# Patient Record
Sex: Male | Born: 1990 | Race: White | Hispanic: No | Marital: Married | Smoking: Never smoker
Health system: Southern US, Community
[De-identification: ages and names within clinical notes are randomized; demographics above are authoritative.]

---
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
## 2016-08-28 ENCOUNTER — Other Ambulatory Visit: Admit: 2016-08-28 | Admitting: Family Medicine

## 2016-09-04 ENCOUNTER — Other Ambulatory Visit
Admission: EM | Admit: 2016-09-04 | Discharge: 2016-09-04 | Disposition: A | Discharge status: Home / Self Care | Attending: Family Medicine | Admitting: Family Medicine

## 2016-09-04 NOTE — ED Notes (Signed)
Completed UDS and delivered to lab for courier p/u.

## 2016-09-05 ENCOUNTER — Emergency Department: Admission: EM | Admit: 2016-09-05 | Discharge: 2016-09-05 | Discharge status: Absent without leave

## 2019-04-11 DIAGNOSIS — H5213 Myopia, bilateral: Secondary | ICD-10-CM | POA: Diagnosis not present

## 2019-09-10 DIAGNOSIS — S91332A Puncture wound without foreign body, left foot, initial encounter: Secondary | ICD-10-CM | POA: Diagnosis not present

## 2019-11-13 DIAGNOSIS — M545 Low back pain: Secondary | ICD-10-CM | POA: Diagnosis not present

## 2019-11-13 DIAGNOSIS — R112 Nausea with vomiting, unspecified: Secondary | ICD-10-CM | POA: Diagnosis not present

## 2019-11-13 DIAGNOSIS — R300 Dysuria: Secondary | ICD-10-CM | POA: Diagnosis not present

## 2019-11-13 DIAGNOSIS — R109 Unspecified abdominal pain: Secondary | ICD-10-CM | POA: Diagnosis not present

## 2019-11-27 NOTE — Progress Notes (Signed)
Scheduled to complete physical 12/05/19 with Dr. Williams.  AMD 

## 2019-11-28 ENCOUNTER — Ambulatory Visit: Payer: Self-pay

## 2019-11-28 ENCOUNTER — Other Ambulatory Visit: Payer: Self-pay

## 2019-11-28 DIAGNOSIS — Z0000 Encounter for general adult medical examination without abnormal findings: Secondary | ICD-10-CM

## 2019-11-28 LAB — POCT URINALYSIS DIPSTICK
Bilirubin, UA: NEGATIVE
Blood, UA: NEGATIVE
Glucose, UA: NEGATIVE
Ketones, UA: NEGATIVE
Leukocytes, UA: NEGATIVE
Nitrite, UA: NEGATIVE
Protein, UA: NEGATIVE
Spec Grav, UA: <=1.005 — AB (ref 1.010–1.025)
Urobilinogen, UA: 0.2 E.U./dL
pH, UA: 6.5 (ref 5.0–8.0)

## 2019-11-29 LAB — CMP12+LP+TP+TSH+6AC+CBC/D/PLT
ALT: 36 IU/L (ref 0–44)
AST: 42 IU/L — ABNORMAL HIGH (ref 0–40)
Albumin/Globulin Ratio: 1.8 (ref 1.2–2.2)
Albumin: 4.4 g/dL (ref 4.1–5.2)
Alkaline Phosphatase: 45 IU/L — ABNORMAL LOW (ref 48–121)
BUN/Creatinine Ratio: 9 (ref 9–20)
BUN: 14 mg/dL (ref 6–20)
Basophils Absolute: 0 10*3/uL (ref 0.0–0.2)
Basos: 1 %
Bilirubin Total: 0.5 mg/dL (ref 0.0–1.2)
Calcium: 9.4 mg/dL (ref 8.7–10.2)
Chloride: 101 mmol/L (ref 96–106)
Chol/HDL Ratio: 2.2 ratio (ref 0.0–5.0)
Cholesterol, Total: 142 mg/dL (ref 100–199)
Creatinine, Ser: 1.51 mg/dL — ABNORMAL HIGH (ref 0.76–1.27)
EOS (ABSOLUTE): 0.1 10*3/uL (ref 0.0–0.4)
Eos: 2 %
Estimated CHD Risk: <0.5 times avg. (ref 0.0–1.0)
Free Thyroxine Index: 2.3 (ref 1.2–4.9)
GFR calc Af Amer: 72 mL/min/{1.73_m2} (ref >59)
GFR calc non Af Amer: 62 mL/min/{1.73_m2} (ref >59)
GGT: 20 IU/L (ref 0–65)
Globulin, Total: 2.5 g/dL (ref 1.5–4.5)
Glucose: 109 mg/dL — ABNORMAL HIGH (ref 65–99)
HDL: 66 mg/dL (ref >39)
Hematocrit: 43.2 % (ref 37.5–51.0)
Hemoglobin: 15 g/dL (ref 13.0–17.7)
Immature Grans (Abs): 0 10*3/uL (ref 0.0–0.1)
Immature Granulocytes: 0 %
Iron: 133 ug/dL (ref 38–169)
LDH: 177 IU/L (ref 121–224)
LDL Chol Calc (NIH): 67 mg/dL (ref 0–99)
Lymphocytes Absolute: 1.7 10*3/uL (ref 0.7–3.1)
Lymphs: 34 %
MCH: 30.4 pg (ref 26.6–33.0)
MCHC: 34.7 g/dL (ref 31.5–35.7)
MCV: 87 fL (ref 79–97)
Monocytes Absolute: 0.5 10*3/uL (ref 0.1–0.9)
Monocytes: 11 %
Neutrophils Absolute: 2.7 10*3/uL (ref 1.4–7.0)
Neutrophils: 52 %
Phosphorus: 3.1 mg/dL (ref 2.8–4.1)
Platelets: 245 10*3/uL (ref 150–450)
Potassium: 4.2 mmol/L (ref 3.5–5.2)
RBC: 4.94 x10E6/uL (ref 4.14–5.80)
RDW: 12.5 % (ref 11.6–15.4)
Sodium: 139 mmol/L (ref 134–144)
T3 Uptake Ratio: 27 % (ref 24–39)
T4, Total: 8.4 ug/dL (ref 4.5–12.0)
TSH: 1.84 u[IU]/mL (ref 0.450–4.500)
Total Protein: 6.9 g/dL (ref 6.0–8.5)
Triglycerides: 34 mg/dL (ref 0–149)
Uric Acid: 5.4 mg/dL (ref 3.8–8.4)
VLDL Cholesterol Cal: 9 mg/dL (ref 5–40)
WBC: 5 10*3/uL (ref 3.4–10.8)

## 2019-12-05 ENCOUNTER — Other Ambulatory Visit: Payer: Self-pay

## 2019-12-05 ENCOUNTER — Ambulatory Visit: Payer: Self-pay | Admitting: Emergency Medicine

## 2019-12-05 ENCOUNTER — Encounter: Payer: Self-pay | Admitting: Emergency Medicine

## 2019-12-05 VITALS — BP 135/84 | HR 66 | Temp 97.7°F | Resp 14 | Ht 73.0 in | Wt 215.0 lb

## 2019-12-05 DIAGNOSIS — Z0000 Encounter for general adult medical examination without abnormal findings: Secondary | ICD-10-CM

## 2019-12-05 DIAGNOSIS — R7989 Other specified abnormal findings of blood chemistry: Secondary | ICD-10-CM

## 2019-12-05 NOTE — Progress Notes (Signed)
ER Provider Note       Time seen: 10:06 AM    I have reviewed the vital signs and the nursing notes.  HISTORY   Chief Complaint Employment Physical    HPI Ronnie Francis is a 29 y.o. male with no known past medical history who presents today for annual physical examination.  Patient denies any specific complaints.  No past medical history on file.  Allergies Patient has no known allergies.  Review of Systems Constitutional: Negative for fever. Cardiovascular: Negative for chest pain. Respiratory: Negative for shortness of breath. Gastrointestinal: Negative for abdominal pain, vomiting and diarrhea. Musculoskeletal: Negative for back pain. Skin: Negative for rash. Neurological: Negative for headaches, focal weakness or numbness.  All systems negative/normal/unremarkable except as stated in the HPI  ____________________________________________   PHYSICAL EXAM:  VITAL SIGNS: Vitals:   12/05/19 0951  BP: 135/84  Pulse: 66  Resp: 14  Temp: 97.7 F (36.5 C)  SpO2: 96%    Constitutional: Alert and oriented. Well appearing and in no distress. Eyes: Conjunctivae are normal. Normal extraocular movements. Cardiovascular: Normal rate, regular rhythm. No murmurs, rubs, or gallops. Respiratory: Normal respiratory effort without tachypnea nor retractions. Breath sounds are clear and equal bilaterally. No wheezes/rales/rhonchi. Gastrointestinal: Soft and nontender. Normal bowel sounds Musculoskeletal: Nontender with normal range of motion in extremities. No lower extremity tenderness nor edema. Neurologic:  Normal speech and language. No gross focal neurologic deficits are appreciated.  Skin:  Skin is warm, dry and intact. No rash noted. Psychiatric: Speech and behavior are normal.  ____________________________________________   LABS (pertinent positives/negatives)  Recent Results (from the past 2160 hour(s))  CMP12+LP+TP+TSH+6AC+CBC/D/Plt     Status: Abnormal    Collection Time: 11/28/19  9:56 AM  Result Value Ref Range   Glucose 109 (H) 65 - 99 mg/dL   Uric Acid 5.4 3.8 - 8.4 mg/dL    Comment:            Therapeutic target for gout patients: <6.0   BUN 14 6 - 20 mg/dL   Creatinine, Ser 1.51 (H) 0.76 - 1.27 mg/dL   GFR calc non Af Amer 62 >59 mL/min/1.73   GFR calc Af Amer 72 >59 mL/min/1.73    Comment: **[facility] currently reports eGFR in compliance with the current**   recommendations of the [employer]. [facility] will   update reporting as new guidelines are published from the NKF-ASN   Task force.    BUN/Creatinine Ratio 9 9 - 20   Sodium 139 134 - 144 mmol/L   Potassium 4.2 3.5 - 5.2 mmol/L   Chloride 101 96 - 106 mmol/L   Calcium 9.4 8.7 - 10.2 mg/dL   Phosphorus 3.1 2.8 - 4.1 mg/dL   Total Protein 6.9 6.0 - 8.5 g/dL   Albumin 4.4 4.1 - 5.2 g/dL   Globulin, Total 2.5 1.5 - 4.5 g/dL   Albumin/Globulin Ratio 1.8 1.2 - 2.2   Bilirubin Total 0.5 0.0 - 1.2 mg/dL   Alkaline Phosphatase 45 (L) 48 - 121 IU/L   LDH 177 121 - 224 IU/L   AST 42 (H) 0 - 40 IU/L   ALT 36 0 - 44 IU/L   GGT 20 0 - 65 IU/L   Iron 133 38 - 169 ug/dL   Cholesterol, Total 142 100 - 199 mg/dL   Triglycerides 34 0 - 149 mg/dL   HDL 66 >39 mg/dL   VLDL Cholesterol Cal 9 5 - 40 mg/dL   LDL Chol Calc (NIH) 67 0 -  99 mg/dL   Chol/HDL Ratio 2.2 0.0 - 5.0 ratio    Comment:                                   T. Chol/HDL Ratio                                             Men  Women                               1/2 Avg.Risk  3.4    3.3                                   Avg.Risk  5.0    4.4                                2X Avg.Risk  9.6    7.1                                3X Avg.Risk 23.4   11.0    Estimated CHD Risk  < 0.5 0.0 - 1.0 times avg.    Comment: The CHD Risk is based on the T. Chol/HDL ratio. Other factors affect CHD Risk such as hypertension, smoking, diabetes, severe obesity, and family history of premature CHD.    TSH 1.840 0.450 - 4.500  uIU/mL   T4, Total 8.4 4.5 - 12.0 ug/dL   T3 Uptake Ratio 27 24 - 39 %   Free Thyroxine Index 2.3 1.2 - 4.9   WBC 5.0 3.4 - 10.8 x10E3/uL   RBC 4.94 4.14 - 5.80 x10E6/uL   Hemoglobin 15.0 13.0 - 17.7 g/dL   Hematocrit 43.2 37.5 - 51.0 %   MCV 87 79 - 97 fL   MCH 30.4 26.6 - 33.0 pg   MCHC 34.7 31 - 35 g/dL   RDW 12.5 11.6 - 15.4 %   Platelets 245 150 - 450 x10E3/uL   Neutrophils 52 Not Estab. %   Lymphs 34 Not Estab. %   Monocytes 11 Not Estab. %   Eos 2 Not Estab. %   Basos 1 Not Estab. %   Neutrophils Absolute 2.7 1 - 7 x10E3/uL   Lymphocytes Absolute 1.7 0 - 3 x10E3/uL   Monocytes Absolute 0.5 0 - 0 x10E3/uL   EOS (ABSOLUTE) 0.1 0.0 - 0.4 x10E3/uL   Basophils Absolute 0.0 0 - 0 x10E3/uL   Immature Granulocytes 0 Not Estab. %   Immature Grans (Abs) 0.0 0.0 - 0.1 x10E3/uL  POCT urinalysis dipstick     Status: Abnormal   Collection Time: 11/28/19  9:57 AM  Result Value Ref Range   Color, UA Light Yellow    Clarity, UA Clear    Glucose, UA Negative Negative   Bilirubin, UA Negative    Ketones, UA Negative    Spec Grav, UA <=1.005 (A) 1.010 - 1.025   Blood, UA Negative    pH, UA 6.5 5.0 - 8.0   Protein, UA Negative Negative   Urobilinogen, UA 0.2 0.2 or 1.0  E.U./dL   Nitrite, UA Negative    Leukocytes, UA Negative Negative   Appearance     Odor      DIFFERENTIAL DIAGNOSIS  Annual physical examination, chronically elevated creatinine  ASSESSMENT AND PLAN  Annual physical examination, elevated creatinine   Plan: The patient had presented for annual physical examination. Patient's labs did reveal a creatinine of 1.5 of uncertain etiology.  This appears to have been the case for the past 5 years, and it is slightly higher on recent lab work.  He will be referred to nephrology for outpatient follow-up.  Lenise Arena MD    Note: This note was generated in part or whole with voice recognition software. Voice recognition is usually quite accurate but there are  transcription errors that can and very often do occur. I apologize for any typographical errors that were not detected and corrected.

## 2019-12-05 NOTE — Addendum Note (Signed)
Addended by: Gardner Candle on: 12/05/2019 11:30 AM   Modules accepted: Orders

## 2020-01-30 DIAGNOSIS — N179 Acute kidney failure, unspecified: Secondary | ICD-10-CM | POA: Insufficient documentation

## 2020-02-01 ENCOUNTER — Other Ambulatory Visit: Payer: Self-pay | Admitting: Nephrology

## 2020-02-01 DIAGNOSIS — N179 Acute kidney failure, unspecified: Secondary | ICD-10-CM

## 2020-02-07 DIAGNOSIS — J029 Acute pharyngitis, unspecified: Secondary | ICD-10-CM | POA: Diagnosis not present

## 2020-02-07 DIAGNOSIS — J069 Acute upper respiratory infection, unspecified: Secondary | ICD-10-CM | POA: Diagnosis not present

## 2020-02-07 DIAGNOSIS — Z03818 Encounter for observation for suspected exposure to other biological agents ruled out: Secondary | ICD-10-CM | POA: Diagnosis not present

## 2020-02-08 ENCOUNTER — Ambulatory Visit: Admission: RE | Admit: 2020-02-08 | Payer: 59 | Source: Ambulatory Visit

## 2020-09-01 ENCOUNTER — Ambulatory Visit: Payer: Self-pay

## 2020-09-01 ENCOUNTER — Other Ambulatory Visit: Payer: Self-pay

## 2020-09-01 DIAGNOSIS — Z0000 Encounter for general adult medical examination without abnormal findings: Secondary | ICD-10-CM

## 2020-09-01 LAB — POCT URINALYSIS DIPSTICK
Bilirubin, UA: NEGATIVE
Blood, UA: NEGATIVE
Glucose, UA: NEGATIVE
Ketones, UA: NEGATIVE
Leukocytes, UA: NEGATIVE
Nitrite, UA: NEGATIVE
Protein, UA: NEGATIVE
Spec Grav, UA: 1.01 (ref 1.010–1.025)
Urobilinogen, UA: 0.2 E.U./dL
pH, UA: 8.5 — AB (ref 5.0–8.0)

## 2020-09-02 LAB — CMP12+LP+TP+TSH+6AC+CBC/D/PLT
ALT: 27 IU/L (ref 0–44)
AST: 28 IU/L (ref 0–40)
Albumin/Globulin Ratio: 2.1 (ref 1.2–2.2)
Albumin: 4.7 g/dL (ref 4.1–5.2)
Alkaline Phosphatase: 48 IU/L (ref 44–121)
BUN/Creatinine Ratio: 13 (ref 9–20)
BUN: 21 mg/dL — ABNORMAL HIGH (ref 6–20)
Basophils Absolute: 0 10*3/uL (ref 0.0–0.2)
Basos: 1 %
Bilirubin Total: 0.4 mg/dL (ref 0.0–1.2)
Calcium: 9.4 mg/dL (ref 8.7–10.2)
Chloride: 101 mmol/L (ref 96–106)
Chol/HDL Ratio: 2.7 ratio (ref 0.0–5.0)
Cholesterol, Total: 166 mg/dL (ref 100–199)
Creatinine, Ser: 1.57 mg/dL — ABNORMAL HIGH (ref 0.76–1.27)
EOS (ABSOLUTE): 0.1 10*3/uL (ref 0.0–0.4)
Eos: 3 %
Estimated CHD Risk: <0.5 times avg. (ref 0.0–1.0)
Free Thyroxine Index: 2.6 (ref 1.2–4.9)
GGT: 17 IU/L (ref 0–65)
Globulin, Total: 2.2 g/dL (ref 1.5–4.5)
Glucose: 90 mg/dL (ref 65–99)
HDL: 62 mg/dL (ref >39)
Hematocrit: 48 % (ref 37.5–51.0)
Hemoglobin: 16.3 g/dL (ref 13.0–17.7)
Immature Grans (Abs): 0 10*3/uL (ref 0.0–0.1)
Immature Granulocytes: 0 %
Iron: 74 ug/dL (ref 38–169)
LDH: 168 IU/L (ref 121–224)
LDL Chol Calc (NIH): 90 mg/dL (ref 0–99)
Lymphocytes Absolute: 2.1 10*3/uL (ref 0.7–3.1)
Lymphs: 43 %
MCH: 30.8 pg (ref 26.6–33.0)
MCHC: 34 g/dL (ref 31.5–35.7)
MCV: 91 fL (ref 79–97)
Monocytes Absolute: 0.5 10*3/uL (ref 0.1–0.9)
Monocytes: 10 %
Neutrophils Absolute: 2.1 10*3/uL (ref 1.4–7.0)
Neutrophils: 43 %
Phosphorus: 3.3 mg/dL (ref 2.8–4.1)
Platelets: 212 10*3/uL (ref 150–450)
Potassium: 4.4 mmol/L (ref 3.5–5.2)
RBC: 5.3 x10E6/uL (ref 4.14–5.80)
RDW: 12.2 % (ref 11.6–15.4)
Sodium: 140 mmol/L (ref 134–144)
T3 Uptake Ratio: 31 % (ref 24–39)
T4, Total: 8.4 ug/dL (ref 4.5–12.0)
TSH: 2.21 u[IU]/mL (ref 0.450–4.500)
Total Protein: 6.9 g/dL (ref 6.0–8.5)
Triglycerides: 74 mg/dL (ref 0–149)
Uric Acid: 4.6 mg/dL (ref 3.8–8.4)
VLDL Cholesterol Cal: 14 mg/dL (ref 5–40)
WBC: 4.8 10*3/uL (ref 3.4–10.8)
eGFR: 61 mL/min/{1.73_m2} (ref >59)

## 2020-09-11 ENCOUNTER — Encounter: Payer: Self-pay | Admitting: Physician Assistant

## 2020-09-18 ENCOUNTER — Encounter: Payer: Self-pay | Admitting: Emergency Medicine

## 2020-09-18 ENCOUNTER — Other Ambulatory Visit: Payer: Self-pay

## 2020-09-18 ENCOUNTER — Ambulatory Visit: Payer: Self-pay | Admitting: Emergency Medicine

## 2020-09-18 VITALS — BP 135/77 | HR 58 | Temp 97.6°F | Resp 14 | Ht 73.0 in | Wt 225.0 lb

## 2020-09-18 DIAGNOSIS — F411 Generalized anxiety disorder: Secondary | ICD-10-CM

## 2020-09-18 DIAGNOSIS — Z0000 Encounter for general adult medical examination without abnormal findings: Secondary | ICD-10-CM

## 2020-09-18 DIAGNOSIS — R0789 Other chest pain: Secondary | ICD-10-CM

## 2020-09-18 NOTE — Progress Notes (Signed)
I have reviewed the triage vital signs and the nursing notes.   HISTORY  Chief Complaint Annual Exam   HPI Ronnie Francis is a 30 y.o. male is here for an annual physical.  Patient also would like to discuss feelings of anxiety that began approximately 1-1/2 years ago.  Patient states that his main symptoms are feeling hard to breathe.  Patient states that it varies and happens at home and at work.  He reports that twice this week while at work he felt this way and sensation was temporary.  He denies any difficulty sleeping.  No GI symptoms associated.  Patient denies any previous panic attack symptoms.  He denies any thoughts of harming himself or others.  Patient denies any family history of known cardiac disease other than his grandmother who is in her 34s.  He did reach out to [employer] by phone but never was called back.       History reviewed. No pertinent past medical history.  Patient Active Problem List   Diagnosis Date Noted  . Acute kidney failure (HCC) 01/30/2020    Past Surgical History:  Procedure Laterality Date  . ANTERIOR CRUCIATE LIGAMENT REPAIR  2011  . CLAVICLE SURGERY  2009  . EYE SURGERY  2000    Prior to Admission medications   Medication Sig Start Date End Date Taking? Authorizing Provider  MULTIPLE VITAMIN PO Take 1 tablet by mouth daily.    [provider]    Allergies Patient has no known allergies.  Family History  Problem Relation Age of Onset  . Heart attack Maternal Grandmother     Social History Social History   Tobacco Use  . Smoking status: Never Smoker  . Smokeless tobacco: Current User    Types: Chew  Substance Use Topics  . Alcohol use: Yes  . Drug use: Never    Review of Systems Constitutional: No fever/chills Eyes: No visual changes. ENT: No sore throat. Cardiovascular: Denies chest pain. Respiratory: Denies difficulty breathing however when he is having panic attack feels it difficult to  breathe. Gastrointestinal: No abdominal pain.  No nausea, no vomiting.   Genitourinary: Negative for dysuria. Musculoskeletal: Negative for musculoskeletal. Skin: Negative for rash. Neurological: Negative for headaches, focal weakness or numbness.  ____________________________________________   PHYSICAL EXAM: Constitutional: Alert and oriented. Well appearing and in no acute distress. Eyes: Conjunctivae are normal. PERRL. EOMI. Head: Atraumatic. Nose: No congestion/rhinnorhea. Mouth/Throat: Mucous membranes are moist.  Neck: No stridor.   Cardiovascular: Normal rate, regular rhythm. Grossly normal heart sounds.  Good peripheral circulation. Respiratory: Normal respiratory effort.  No retractions. Lungs CTAB. Gastrointestinal: Soft and nontender. No distention.  Bowel sounds are normoactive x4 quadrants. Musculoskeletal: Nontender thoracic or lumbar spine.  Patient is able move upper and lower extremities without any difficulty.  Normal gait was noted. Neurologic:  Normal speech and language. No gross focal neurologic deficits are appreciated. No gait instability. Skin:  Skin is warm, dry and intact. No rash noted. Psychiatric: Mood and affect are normal. Speech and behavior are normal.  ____________________________________________   LABS (all labs ordered are listed, but only abnormal results are displayed)  Labs were discussed. ____________________________________________  EKG  Sinus bradycardia with a ventricular rate of 58 ____________________________________________ ____________________________________________    FINAL CLINICAL IMPRESSION(S)   Normal physical exam. Elevated BUN/creatinine with minimal elevation from his last labs.  Patient was referred to  nephrologist for further evaluation.  EKG was reassuring.  Also today patient will talk to Amie who is getting  him in touch with [facility] to make arrangements for evaluation.  Patient is aware that we can place him on  some antianxiety medication after evaluation from a [facility] that may help.    ED Discharge Orders         Ordered    EKG 12-Lead        09/18/20 1541           Note:  This document was prepared using Dragon voice recognition software and may include unintentional dictation errors.

## 2020-09-30 ENCOUNTER — Ambulatory Visit: Payer: 59 | Admitting: Emergency Medicine

## 2020-10-01 ENCOUNTER — Ambulatory Visit: Payer: 59 | Admitting: Nurse Practitioner

## 2020-10-03 ENCOUNTER — Other Ambulatory Visit: Payer: Self-pay

## 2020-10-03 ENCOUNTER — Ambulatory Visit: Payer: Self-pay | Admitting: Adult Health

## 2020-10-03 ENCOUNTER — Encounter: Payer: Self-pay | Admitting: Adult Health

## 2020-10-03 VITALS — BP 128/76 | HR 53 | Temp 98.3°F | Resp 14 | Ht 73.0 in | Wt 220.0 lb

## 2020-10-03 DIAGNOSIS — F411 Generalized anxiety disorder: Secondary | ICD-10-CM

## 2020-10-03 MED ORDER — CLONAZEPAM 0.25 MG PO TBDP: 0.2500 mg | ORAL_TABLET | Freq: Two times a day (BID) | ORAL | 0 refills | Status: DC | PRN

## 2020-10-03 MED ORDER — CLONAZEPAM 0.25 MG PO TBDP: 0.2500 mg | ORAL_TABLET | Freq: Two times a day (BID) | ORAL | 0 refills | Status: AC | PRN

## 2020-10-03 NOTE — Progress Notes (Signed)
[facility] [address] [place], [place] [address]   Office Visit Note  Patient Name: Ronnie Francis  [id number]  [id number]  Date of Service: 10/03/2020  Chief Complaint  Patient presents with  . f/u anxiety     HPI Pt is here for a follow up on anxiety. Patient reports he has not had any episodes since his last appt here in clinic.  He states these symptoms often happen at home when he is relaxing. He is attempting to get setup with [facility].  Symptoms during panic attacks include sob, and rapid heart beat. He has a lot of stress in his life at the moment, he is getting married in 2 weeks, and has a 30 year old.      Current Medication:  Outpatient Encounter Medications as of 10/03/2020  Medication Sig  . MULTIPLE VITAMIN PO Take 1 tablet by mouth daily.  . clonazePAM (KLONOPIN) 0.25 MG disintegrating tablet Take 1 tablet (0.25 mg total) by mouth 2 (two) times daily as needed for seizure.   No facility-administered encounter medications on file as of 10/03/2020.      Medical History: History reviewed. No pertinent past medical history.   Vital Signs: BP 128/76   Pulse (!) 53   Temp 98.3 F (36.8 C)   Resp 14   Ht 6\' 1"  (1.854 m)   Wt 220 lb (99.8 kg)   SpO2 96%   BMI 29.03 kg/m    Review of Systems  Psychiatric/Behavioral: Negative for agitation, confusion, dysphoric mood, self-injury, sleep disturbance and suicidal ideas. The patient is nervous/anxious.   All other systems reviewed and are negative.   Physical Exam Constitutional:      Appearance: Normal appearance.  Neurological:     Mental Status: He is alert.  Psychiatric:        Mood and Affect: Mood normal.        Thought Content: Thought content normal.        Judgment: Judgment normal.       Assessment/Plan: 1. Generalized anxiety disorder Discussed using Konopin as needed for panic attacks or symptoms.  Advised patient to be watchful when taking medication, as it may make him  sleepy or impaired.  He verbalized understanding. Instructed not to mix with alcohol. If he finds that he needs more than 2 doses a week, he will call clinic for follow up.  - clonazePAM (KLONOPIN) 0.25 MG disintegrating tablet; Take 1 tablet (0.25 mg total) by mouth 2 (two) times daily as needed (panic attacks).  Dispense: 30 tablet; Refill: 0    General Counseling: Juanjesus verbalizes understanding of the findings of todays visit and agrees with plan of treatment. I have discussed any further diagnostic evaluation that may be needed or ordered today. We also reviewed his medications today. he has been encouraged to call the office with any questions or concerns that should arise related to todays visit.   No orders of the defined types were placed in this encounter.   Meds ordered this encounter  Medications  . clonazePAM (KLONOPIN) 0.25 MG disintegrating tablet    Sig: Take 1 tablet (0.25 mg total) by mouth 2 (two) times daily as needed for seizure.    Dispense:  30 tablet    Refill:  0    Time spent: 30  Minutes    Earna Coder AGNP-C Nurse Practitioner

## 2020-10-07 ENCOUNTER — Telehealth: Payer: Self-pay

## 2020-10-07 NOTE — Telephone Encounter (Signed)
Ronnie Francis contacted the clinic to report that he received a text from [facility] stating they couldn't fill the Rx for Clonazepam that Blima Ledger, [facility] prescribed for him on Friday (10/03/20).  Contacted [facility] [phone]) - spoke with a pharmacist. He said Rx flagged by their regulatory system. Then he said everything was OK with Rx - nothing further was needed from [place]. Said [facility] will [facility] the Rx & notify patient when ready.  Contacted Zack & relayed to him that [facility] will fill his Rx & notify him when it's available for pick-up.  AMD

## 2020-10-31 ENCOUNTER — Telehealth: Payer: Self-pay | Admitting: Adult Health

## 2020-10-31 DIAGNOSIS — F411 Generalized anxiety disorder: Secondary | ICD-10-CM

## 2020-10-31 NOTE — Progress Notes (Signed)
[facility] [address] [place] [address] [place], [place] [address]   Office Visit Note Telephone visit.  Patient Name: Ronnie Francis  [id number]  [id number]  Date of Service: 10/31/2020  Chief Complaint  Patient presents with  . Anxiety     HPI Pt is seen via telephone for follow up on anxiety.  At last office visit he was having intermittent bouts of anxiety that were bordering on panic attack-like symptoms.  He was prescribed clonazepam as needed and was referred to [facility] for counseling.  He reports he has been doing well.  He did get married as planned and went to [place].  He did report one minor episode of anxiety while there, however it was short lived.  He has not required a single dose of Clonazepam since filling it.  He reports feeling better knowing he has it if needed.     Current Medication:  Outpatient Encounter Medications as of 10/31/2020  Medication Sig  . clonazePAM (KLONOPIN) 0.25 MG disintegrating tablet Take 1 tablet (0.25 mg total) by mouth 2 (two) times daily as needed (panic attacks).  . MULTIPLE VITAMIN PO Take 1 tablet by mouth daily.   No facility-administered encounter medications on file as of 10/31/2020.      Medical History: No past medical history on file.   Vital Signs: There were no vitals taken for this visit.   Review of Systems  Constitutional: Negative for activity change, appetite change and fatigue.  Neurological: Negative for dizziness, weakness and headaches.  Psychiatric/Behavioral: Negative for agitation, behavioral problems, confusion, hallucinations and suicidal ideas. The patient is not nervous/anxious.     Physical Exam No physical exam due to telephone visit.     Assessment/Plan: 1. Generalized anxiety disorder Patient reports good coping skills at this time.  Feeling much improved.  May use Clonazepam as discussed.  If episodes return or become more frequent, he is instructed to follow up in clinic asap.     General  Counseling: gabrian hoque understanding of the findings of todays visit and agrees with plan of treatment. I have discussed any further diagnostic evaluation that may be needed or ordered today. We also reviewed his medications today. he has been encouraged to call the office with any questions or concerns that should arise related to todays visit.     Time spent: 20 Minutes    Johnna Acosta AGNP-C Nurse Practitioner

## 2020-12-03 DIAGNOSIS — M62830 Muscle spasm of back: Secondary | ICD-10-CM | POA: Diagnosis not present

## 2020-12-03 DIAGNOSIS — M9903 Segmental and somatic dysfunction of lumbar region: Secondary | ICD-10-CM | POA: Diagnosis not present

## 2020-12-03 DIAGNOSIS — M9905 Segmental and somatic dysfunction of pelvic region: Secondary | ICD-10-CM | POA: Diagnosis not present

## 2020-12-03 DIAGNOSIS — M5416 Radiculopathy, lumbar region: Secondary | ICD-10-CM | POA: Diagnosis not present

## 2020-12-04 DIAGNOSIS — M9903 Segmental and somatic dysfunction of lumbar region: Secondary | ICD-10-CM | POA: Diagnosis not present

## 2020-12-04 DIAGNOSIS — M62830 Muscle spasm of back: Secondary | ICD-10-CM | POA: Diagnosis not present

## 2020-12-04 DIAGNOSIS — M9905 Segmental and somatic dysfunction of pelvic region: Secondary | ICD-10-CM | POA: Diagnosis not present

## 2020-12-04 DIAGNOSIS — M5416 Radiculopathy, lumbar region: Secondary | ICD-10-CM | POA: Diagnosis not present

## 2020-12-09 DIAGNOSIS — M62830 Muscle spasm of back: Secondary | ICD-10-CM | POA: Diagnosis not present

## 2020-12-09 DIAGNOSIS — M5416 Radiculopathy, lumbar region: Secondary | ICD-10-CM | POA: Diagnosis not present

## 2020-12-09 DIAGNOSIS — M9903 Segmental and somatic dysfunction of lumbar region: Secondary | ICD-10-CM | POA: Diagnosis not present

## 2020-12-09 DIAGNOSIS — M9905 Segmental and somatic dysfunction of pelvic region: Secondary | ICD-10-CM | POA: Diagnosis not present

## 2020-12-10 DIAGNOSIS — M9905 Segmental and somatic dysfunction of pelvic region: Secondary | ICD-10-CM | POA: Diagnosis not present

## 2020-12-10 DIAGNOSIS — M62830 Muscle spasm of back: Secondary | ICD-10-CM | POA: Diagnosis not present

## 2020-12-10 DIAGNOSIS — M9903 Segmental and somatic dysfunction of lumbar region: Secondary | ICD-10-CM | POA: Diagnosis not present

## 2020-12-10 DIAGNOSIS — M5416 Radiculopathy, lumbar region: Secondary | ICD-10-CM | POA: Diagnosis not present

## 2020-12-12 DIAGNOSIS — M9905 Segmental and somatic dysfunction of pelvic region: Secondary | ICD-10-CM | POA: Diagnosis not present

## 2020-12-12 DIAGNOSIS — M9903 Segmental and somatic dysfunction of lumbar region: Secondary | ICD-10-CM | POA: Diagnosis not present

## 2020-12-12 DIAGNOSIS — M5416 Radiculopathy, lumbar region: Secondary | ICD-10-CM | POA: Diagnosis not present

## 2020-12-12 DIAGNOSIS — M62830 Muscle spasm of back: Secondary | ICD-10-CM | POA: Diagnosis not present

## 2020-12-15 DIAGNOSIS — M9903 Segmental and somatic dysfunction of lumbar region: Secondary | ICD-10-CM | POA: Diagnosis not present

## 2020-12-15 DIAGNOSIS — M5416 Radiculopathy, lumbar region: Secondary | ICD-10-CM | POA: Diagnosis not present

## 2020-12-15 DIAGNOSIS — M9905 Segmental and somatic dysfunction of pelvic region: Secondary | ICD-10-CM | POA: Diagnosis not present

## 2020-12-15 DIAGNOSIS — M62830 Muscle spasm of back: Secondary | ICD-10-CM | POA: Diagnosis not present

## 2020-12-17 DIAGNOSIS — M9903 Segmental and somatic dysfunction of lumbar region: Secondary | ICD-10-CM | POA: Diagnosis not present

## 2020-12-17 DIAGNOSIS — M62830 Muscle spasm of back: Secondary | ICD-10-CM | POA: Diagnosis not present

## 2020-12-17 DIAGNOSIS — M9905 Segmental and somatic dysfunction of pelvic region: Secondary | ICD-10-CM | POA: Diagnosis not present

## 2020-12-17 DIAGNOSIS — M5416 Radiculopathy, lumbar region: Secondary | ICD-10-CM | POA: Diagnosis not present

## 2020-12-18 DIAGNOSIS — M9903 Segmental and somatic dysfunction of lumbar region: Secondary | ICD-10-CM | POA: Diagnosis not present

## 2020-12-18 DIAGNOSIS — M62830 Muscle spasm of back: Secondary | ICD-10-CM | POA: Diagnosis not present

## 2020-12-18 DIAGNOSIS — M5416 Radiculopathy, lumbar region: Secondary | ICD-10-CM | POA: Diagnosis not present

## 2020-12-18 DIAGNOSIS — M9905 Segmental and somatic dysfunction of pelvic region: Secondary | ICD-10-CM | POA: Diagnosis not present

## 2020-12-23 DIAGNOSIS — M9905 Segmental and somatic dysfunction of pelvic region: Secondary | ICD-10-CM | POA: Diagnosis not present

## 2020-12-23 DIAGNOSIS — M9903 Segmental and somatic dysfunction of lumbar region: Secondary | ICD-10-CM | POA: Diagnosis not present

## 2020-12-23 DIAGNOSIS — M62830 Muscle spasm of back: Secondary | ICD-10-CM | POA: Diagnosis not present

## 2020-12-23 DIAGNOSIS — M5416 Radiculopathy, lumbar region: Secondary | ICD-10-CM | POA: Diagnosis not present

## 2020-12-25 DIAGNOSIS — M5416 Radiculopathy, lumbar region: Secondary | ICD-10-CM | POA: Diagnosis not present

## 2020-12-25 DIAGNOSIS — M62830 Muscle spasm of back: Secondary | ICD-10-CM | POA: Diagnosis not present

## 2020-12-25 DIAGNOSIS — M9903 Segmental and somatic dysfunction of lumbar region: Secondary | ICD-10-CM | POA: Diagnosis not present

## 2020-12-25 DIAGNOSIS — M9905 Segmental and somatic dysfunction of pelvic region: Secondary | ICD-10-CM | POA: Diagnosis not present

## 2020-12-26 ENCOUNTER — Emergency Department: Payer: No Typology Code available for payment source

## 2020-12-26 ENCOUNTER — Encounter: Payer: Self-pay | Admitting: Emergency Medicine

## 2020-12-26 ENCOUNTER — Emergency Department
Admission: EM | Admit: 2020-12-26 | Discharge: 2020-12-26 | Disposition: A | Discharge status: Home / Self Care | Payer: No Typology Code available for payment source | Attending: Emergency Medicine | Admitting: Emergency Medicine

## 2020-12-26 ENCOUNTER — Other Ambulatory Visit: Payer: Self-pay

## 2020-12-26 DIAGNOSIS — W25XXXA Contact with sharp glass, initial encounter: Secondary | ICD-10-CM | POA: Diagnosis not present

## 2020-12-26 DIAGNOSIS — Y92009 Unspecified place in unspecified non-institutional (private) residence as the place of occurrence of the external cause: Secondary | ICD-10-CM | POA: Insufficient documentation

## 2020-12-26 DIAGNOSIS — S6991XA Unspecified injury of right wrist, hand and finger(s), initial encounter: Secondary | ICD-10-CM | POA: Diagnosis present

## 2020-12-26 DIAGNOSIS — Y990 Civilian activity done for income or pay: Secondary | ICD-10-CM | POA: Diagnosis not present

## 2020-12-26 DIAGNOSIS — S61411A Laceration without foreign body of right hand, initial encounter: Secondary | ICD-10-CM | POA: Diagnosis not present

## 2020-12-26 MED ORDER — LIDOCAINE HCL (PF) 1 % IJ SOLN
5.0000 mL | Freq: Once | INTRAMUSCULAR | Status: AC
  Administered 2020-12-26: 5 mL via INTRADERMAL
  Filled 2020-12-26: qty 5

## 2020-12-26 MED ORDER — BACITRACIN ZINC 500 UNIT/GM EX OINT
TOPICAL_OINTMENT | Freq: Once | CUTANEOUS | Status: AC
  Filled 2020-12-26: qty 0.9

## 2020-12-26 NOTE — ED Notes (Signed)
Worker's Comp: Pt's employer requests a UDS for W/C.  Consents signed and specimen obtained and released to lab.  Specimen ID no [id number]
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
## 2020-12-26 NOTE — ED Triage Notes (Signed)
Patient ambulatory to triage with steady gait, without difficulty or distress noted; pt reports cutting rt hand on glass PTA while at work (employed with [employer]--workers comp profile indicates UDS required); small superficial cut to 5th digit and approx 1" lac between 2nd & 3rd digits with no active bleeding; guaze dressing applied

## 2020-12-26 NOTE — Discharge Instructions (Addendum)
You may alternate Tylenol 1000 mg every 6 hours as needed for pain, fever and Ibuprofen 800 mg every 8 hours as needed for pain, fever.  Please take Ibuprofen with food.  Do not take more than 4000 mg of Tylenol (acetaminophen) in a 24 hour period.  You may clean this wound gently with warm soap and water 1-2 times a day.  You may apply over-the-counter Neosporin ointment as needed and cover with a bandage.  Your sutures are absorbable and will come out on their own in 7 to 14 days.  You may return to full duty work today.

## 2020-12-29 DIAGNOSIS — M62830 Muscle spasm of back: Secondary | ICD-10-CM | POA: Diagnosis not present

## 2020-12-29 DIAGNOSIS — M9903 Segmental and somatic dysfunction of lumbar region: Secondary | ICD-10-CM | POA: Diagnosis not present

## 2020-12-29 DIAGNOSIS — M9905 Segmental and somatic dysfunction of pelvic region: Secondary | ICD-10-CM | POA: Diagnosis not present

## 2020-12-29 DIAGNOSIS — M5416 Radiculopathy, lumbar region: Secondary | ICD-10-CM | POA: Diagnosis not present

## 2021-01-08 DIAGNOSIS — M5416 Radiculopathy, lumbar region: Secondary | ICD-10-CM | POA: Diagnosis not present

## 2021-01-08 DIAGNOSIS — M62830 Muscle spasm of back: Secondary | ICD-10-CM | POA: Diagnosis not present

## 2021-01-08 DIAGNOSIS — M9903 Segmental and somatic dysfunction of lumbar region: Secondary | ICD-10-CM | POA: Diagnosis not present

## 2021-01-08 DIAGNOSIS — M9905 Segmental and somatic dysfunction of pelvic region: Secondary | ICD-10-CM | POA: Diagnosis not present

## 2021-01-22 DIAGNOSIS — M9903 Segmental and somatic dysfunction of lumbar region: Secondary | ICD-10-CM | POA: Diagnosis not present

## 2021-01-22 DIAGNOSIS — M9905 Segmental and somatic dysfunction of pelvic region: Secondary | ICD-10-CM | POA: Diagnosis not present

## 2021-01-22 DIAGNOSIS — M5416 Radiculopathy, lumbar region: Secondary | ICD-10-CM | POA: Diagnosis not present

## 2021-01-22 DIAGNOSIS — M62830 Muscle spasm of back: Secondary | ICD-10-CM | POA: Diagnosis not present

## 2021-02-19 DIAGNOSIS — M5416 Radiculopathy, lumbar region: Secondary | ICD-10-CM | POA: Diagnosis not present

## 2021-02-19 DIAGNOSIS — M62830 Muscle spasm of back: Secondary | ICD-10-CM | POA: Diagnosis not present

## 2021-02-19 DIAGNOSIS — M9903 Segmental and somatic dysfunction of lumbar region: Secondary | ICD-10-CM | POA: Diagnosis not present

## 2021-02-19 DIAGNOSIS — M9905 Segmental and somatic dysfunction of pelvic region: Secondary | ICD-10-CM | POA: Diagnosis not present

## 2021-09-03 IMAGING — CR DG HAND COMPLETE 3+V*R*
3 series · 3 of 3 positions shown · non-contrast
Comparison: None.

CLINICAL DATA: Hand laceration

EXAM:
RIGHT HAND - COMPLETE 3+ VIEW

[hand ap]
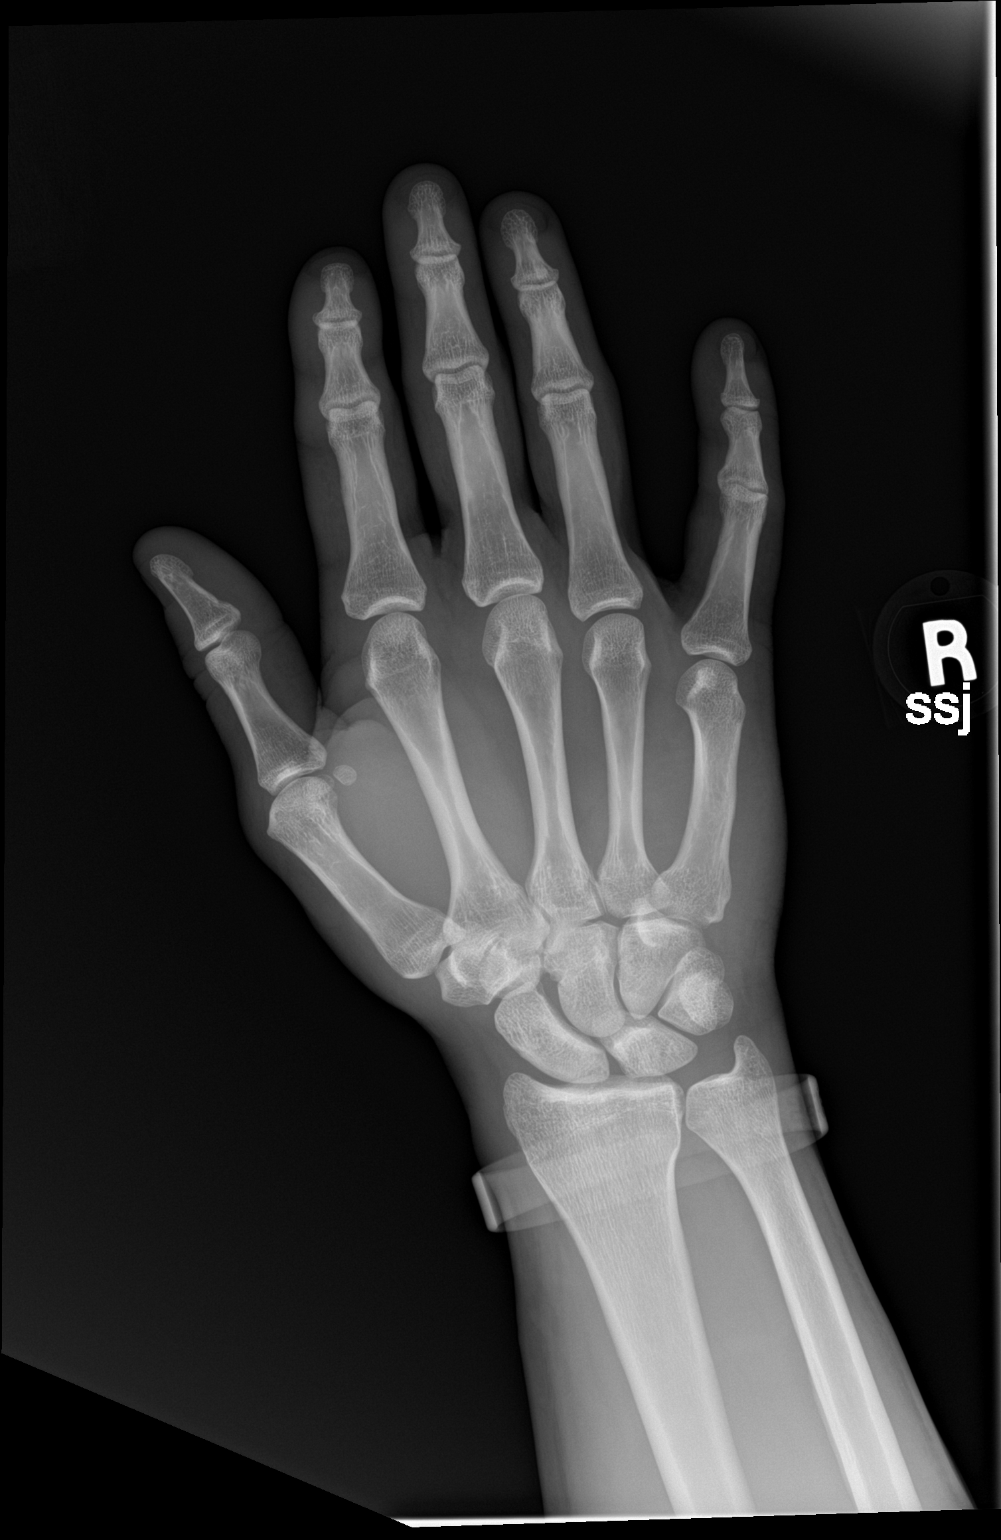

[hand obl]
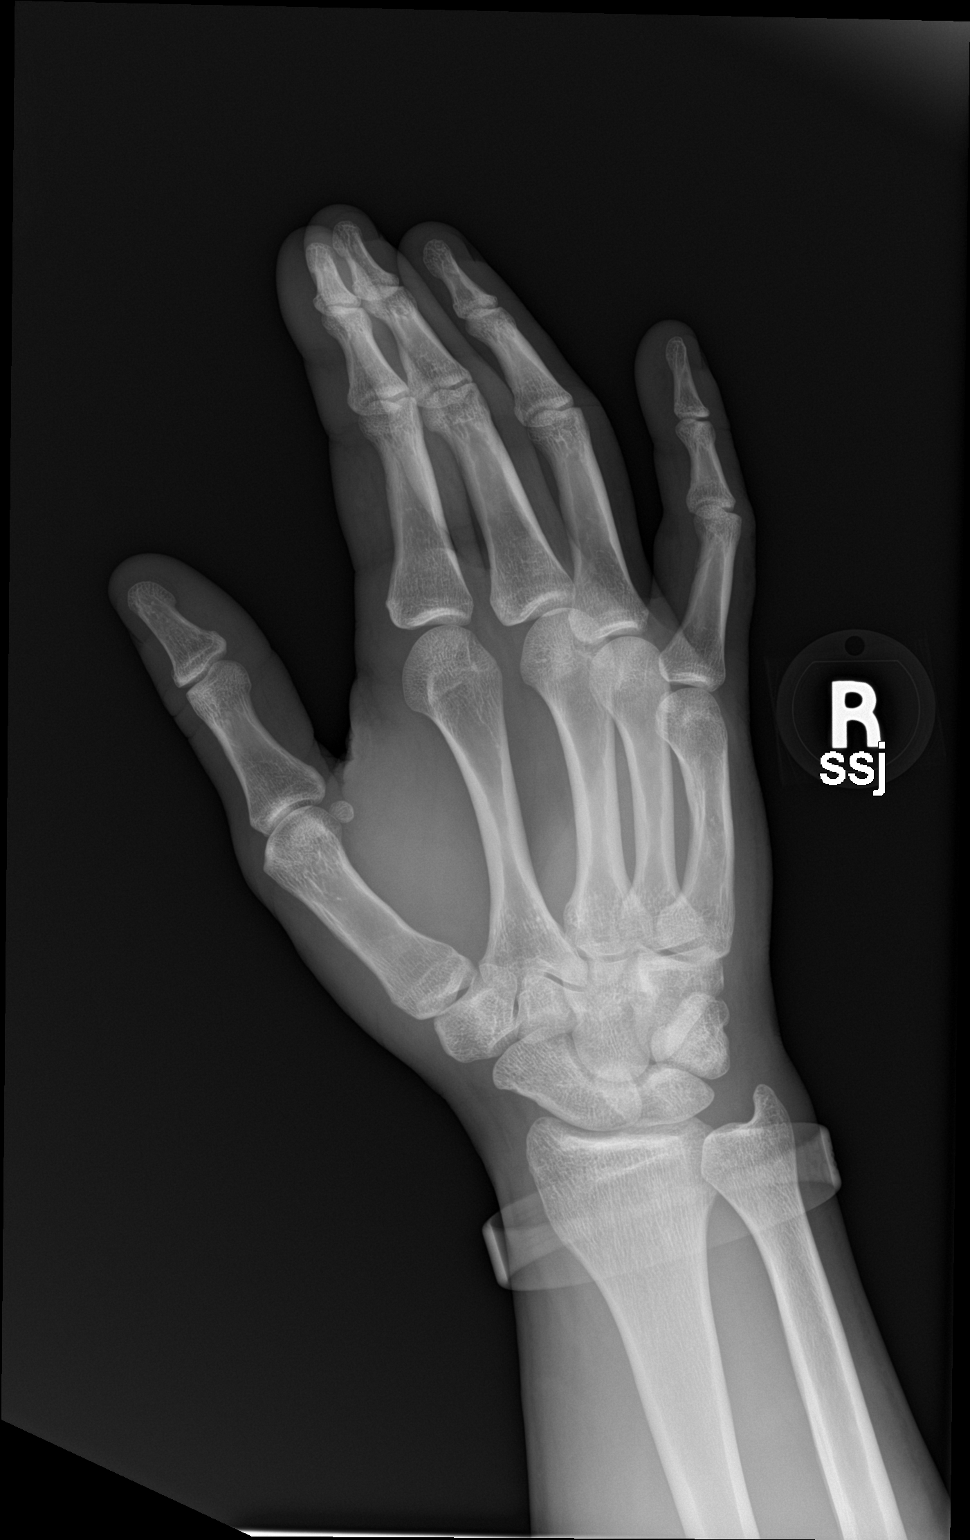

[hand lat]
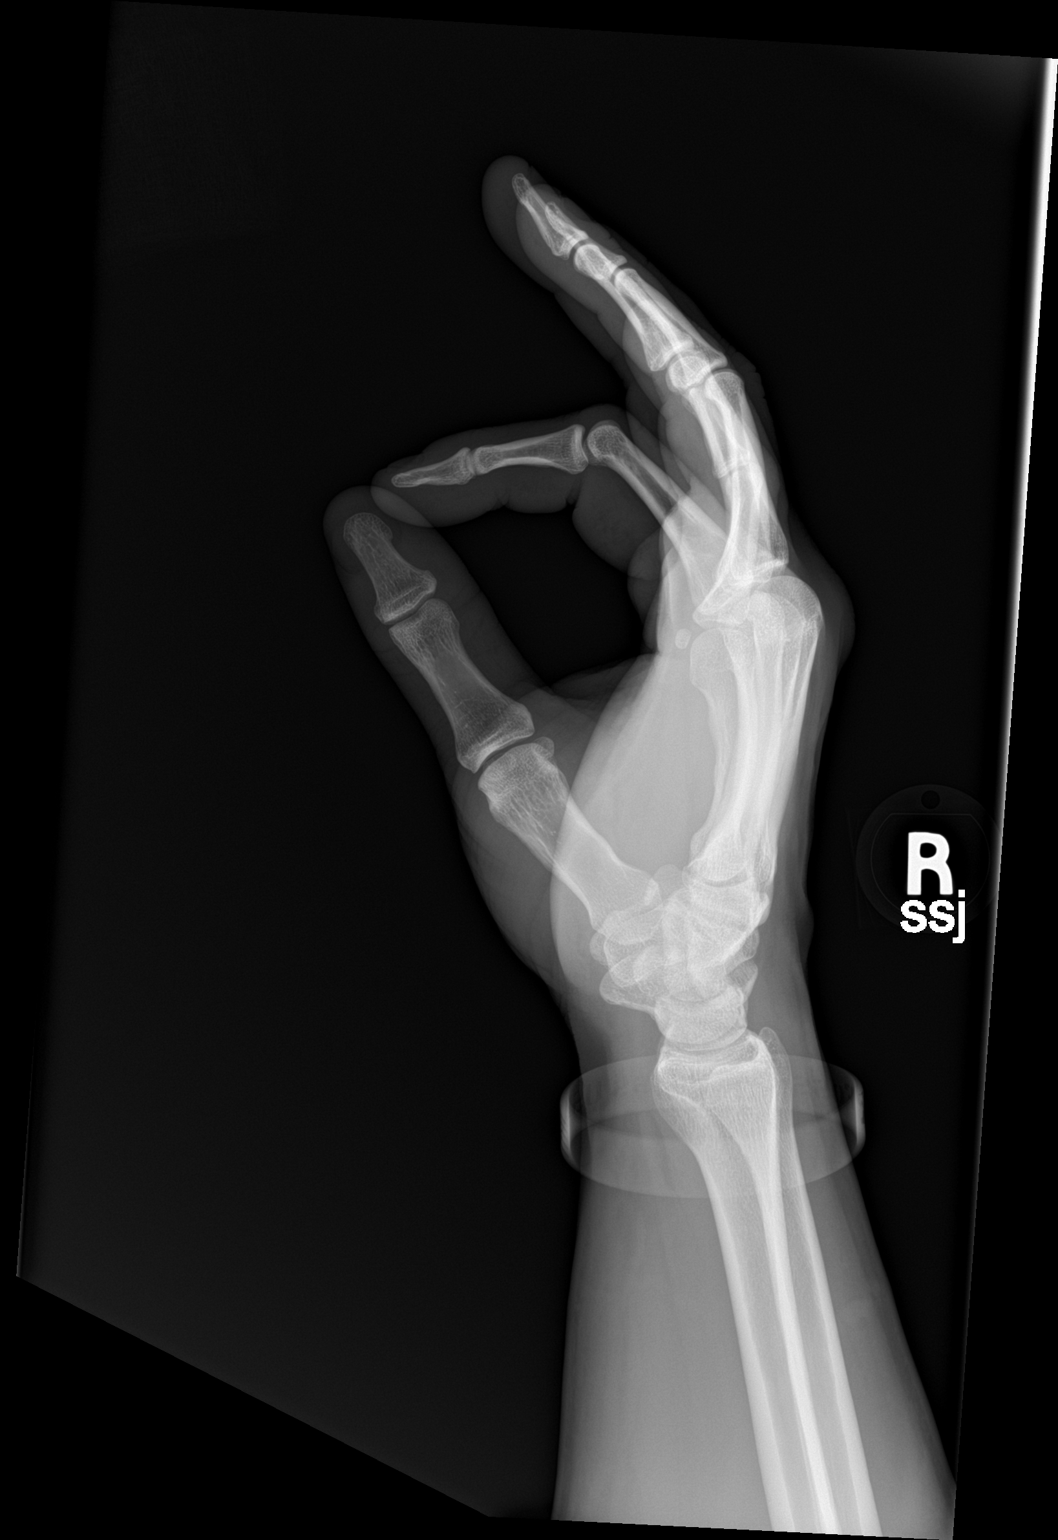

[3 of 3 positions shown; findings below may reference images not displayed]

FINDINGS: There is no evidence of fracture or dislocation. There is no
evidence of arthropathy or other focal bone abnormality. Soft
tissues are unremarkable.
IMPRESSION: Negative.
